# Patient Record
Sex: Male | Born: 1975
Health system: Southern US, Community
[De-identification: ages and names within clinical notes are randomized; demographics above are authoritative.]

## PROBLEM LIST (undated history)

## (undated) DIAGNOSIS — R0789 Other chest pain: Secondary | ICD-10-CM

## (undated) DIAGNOSIS — K219 Gastro-esophageal reflux disease without esophagitis: Secondary | ICD-10-CM

## (undated) HISTORY — DX: Gastro-esophageal reflux disease without esophagitis: K21.9

## (undated) HISTORY — DX: Other chest pain: R07.89

---

## 2006-08-18 ENCOUNTER — Emergency Department (HOSPITAL_COMMUNITY): Admission: EM | Admit: 2006-08-18 | Discharge: 2006-08-18 | Payer: Self-pay | Admitting: Emergency Medicine

## 2008-04-28 ENCOUNTER — Emergency Department (HOSPITAL_COMMUNITY): Admission: EM | Admit: 2008-04-28 | Discharge: 2008-04-29 | Payer: Self-pay | Admitting: Emergency Medicine

## 2010-05-17 ENCOUNTER — Encounter: Payer: Self-pay | Admitting: Cardiology

## 2010-05-17 DIAGNOSIS — R079 Chest pain, unspecified: Secondary | ICD-10-CM

## 2010-05-17 DIAGNOSIS — R9431 Abnormal electrocardiogram [ECG] [EKG]: Secondary | ICD-10-CM

## 2010-05-20 ENCOUNTER — Ambulatory Visit: Payer: Self-pay | Admitting: Cardiology

## 2010-07-07 NOTE — Assessment & Plan Note (Signed)
Summary: ***np6/chest pains /abnormal ekg pt has uhc-mb   Visit Type:  Initial Consult Primary [name]:  Dr.Robert kelly  CC:  chest pain abnormal ekg.  History of Present Illness: [name] comes to office today for evaluation and management of some discomfort in his chest and left arm as well as an abnormal EKG.  He has developed a very light flutter that goes down the inside of his left arm over the past week or so. Sometimes shooting sensation as well. On last seconds. He says the chest pressure in the center of his chest over the last 2-3 days. It is not related to exertion. He does get mildly short of breath with short burst of activity and does not exercise on a regular basis. 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 He has 2 small children. He says is under quite a bit of stress.  He denies any exertional presyncope or syncope, palpitations, dizziness, or chest discomfort. He's never had a previous cardiac condition. He has no risk factors for coronary disease.He  has a history of gastroesophageal reflux but these symptoms are different.    Preventive Screening-Counseling & Management  Alcohol-Tobacco     Smoking Status: never  Caffeine-Diet-Exercise     Does Patient Exercise: no      Drug Use:  no.    Current Medications (verified): 1)  Prilosec 20 Mg Cpdr (Omeprazole) .... Take 1 Tab Daily 2)  Omeprazole 40 Mg Cpdr (Omeprazole) .... Take 1 Tab Daily 3)  Mucinex D 60-600 Mg Xr12h-Tab (Pseudoephedrine-Guaifenesin) .... Take As Needed 4)  Lotemax 0.5 % Susp (Loteprednol Etabonate) .Marland Kitchen.. 1 Drop Two Times A Day 5)  Restasis 0.05 % Emul (Cyclosporine) .Marland Kitchen.. 1drop Each Eye Two Times A Day  Allergies (verified): 1)  ! * Codene 2)  ! * Vicodine 3)  ! * Shell Fish  Social History: Full Time Married  Tobacco Use - No.  Alcohol Use - no Regular Exercise - no Drug Use - no Smoking Status:  never Does Patient Exercise:  no Drug Use:   no  Review of Systems       negative other than history of present illness  Vital Signs:  Patient profile:   35 year old male Height:      70 inches Weight:      196 pounds BMI:     28.22 O2 Sat:      98 % on Room air Pulse rate:   75 / minute BP sitting:   138 / 89  (right arm)  Vitals Entered By: Dreama Saa, CNA (May 17, 2010 1:37 PM)  O2 Flow:  Room air  Physical Exam  General:  Well developed, well nourished, in no acute distress. Head:  normocephalic and atraumatic Eyes:  PERRLA/EOM intact; conjunctiva and lids normal. Neck:  Neck supple, no JVD. No masses, thyromegaly or abnormal cervical nodes. Chest Wall:  no deformities or breast masses noted Lungs:  Clear bilaterally to auscultation and percussion. Heart:  PMI nondisplaced, regular rate and rhythm, mild sinus arrhythmia, no S1-S2 splits, no murmur click, no carotid bruits Abdomen:  L., good bowel sounds, no tenderness no bruit Msk:  Back normal, normal gait. Muscle strength and tone normal. Pulses:  pulses normal in all 4 extremities Extremities:  No clubbing or cyanosis. Neurologic:  Alert and oriented x 3. Skin:  Intact without lesions or rashes. Psych:  Normal affect.   Problems:  Medical Problems Added: 1)  Dx of Abnormal Ekg  (ICD-794.31) 2)  Dx of Chest Pain-unspecified  (ICD-786.50)  Impression & Recommendations:  Problem # 1:  CHEST PAIN-UNSPECIFIED (ICD-786.50) Assessment New I feel this is noncardiac. History is benign, exam is normal, his EKG is essentially normal except for a minimal amount of R. prime in V1 and 2. I explained this to him at length. I feel a lot of this is stress and lack of exercise. Reassurance given and advised to exercise 45 minutes 4 times a week. I told him to choose something enjoys doing so he'll continue with it. No other cardiac evaluation necessary or indicated.  Problem # 2:  ABNORMAL EKG (ICD-794.31) Assessment: New minimal RSR prime V1 V2, clinically not  significant at this time.  Patient Instructions: 1)  Your physician recommends that you schedule a follow-up appointment in: as needed 2)  Your physician discussed the importance of regular exercise and recommended that you start or continue a regular exercise program for good health.

## 2010-07-07 NOTE — Letter (Signed)
Summary: [facility]   Imported By: Faythe Ghee 05/17/2010 15:31:49  _____________________________________________________________________  External Attachment:    Type:   Image     Comment:   External Document

## 2011-05-18 ENCOUNTER — Encounter: Payer: Self-pay | Admitting: Cardiology

## 2014-09-10 ENCOUNTER — Other Ambulatory Visit: Payer: Self-pay | Admitting: Occupational Medicine

## 2014-09-10 ENCOUNTER — Ambulatory Visit: Payer: Self-pay

## 2014-09-10 DIAGNOSIS — M79671 Pain in right foot: Secondary | ICD-10-CM

## 2015-12-04 IMAGING — CR DG FOOT COMPLETE 3+V*R*
3 series · 3 of 3 positions shown · non-contrast
Comparison: None.

CLINICAL DATA: Right foot injury yesterday, dorsal metatarsal pain

EXAM:
RIGHT FOOT COMPLETE - 3+ VIEW

[view not recorded (1 of 3)]
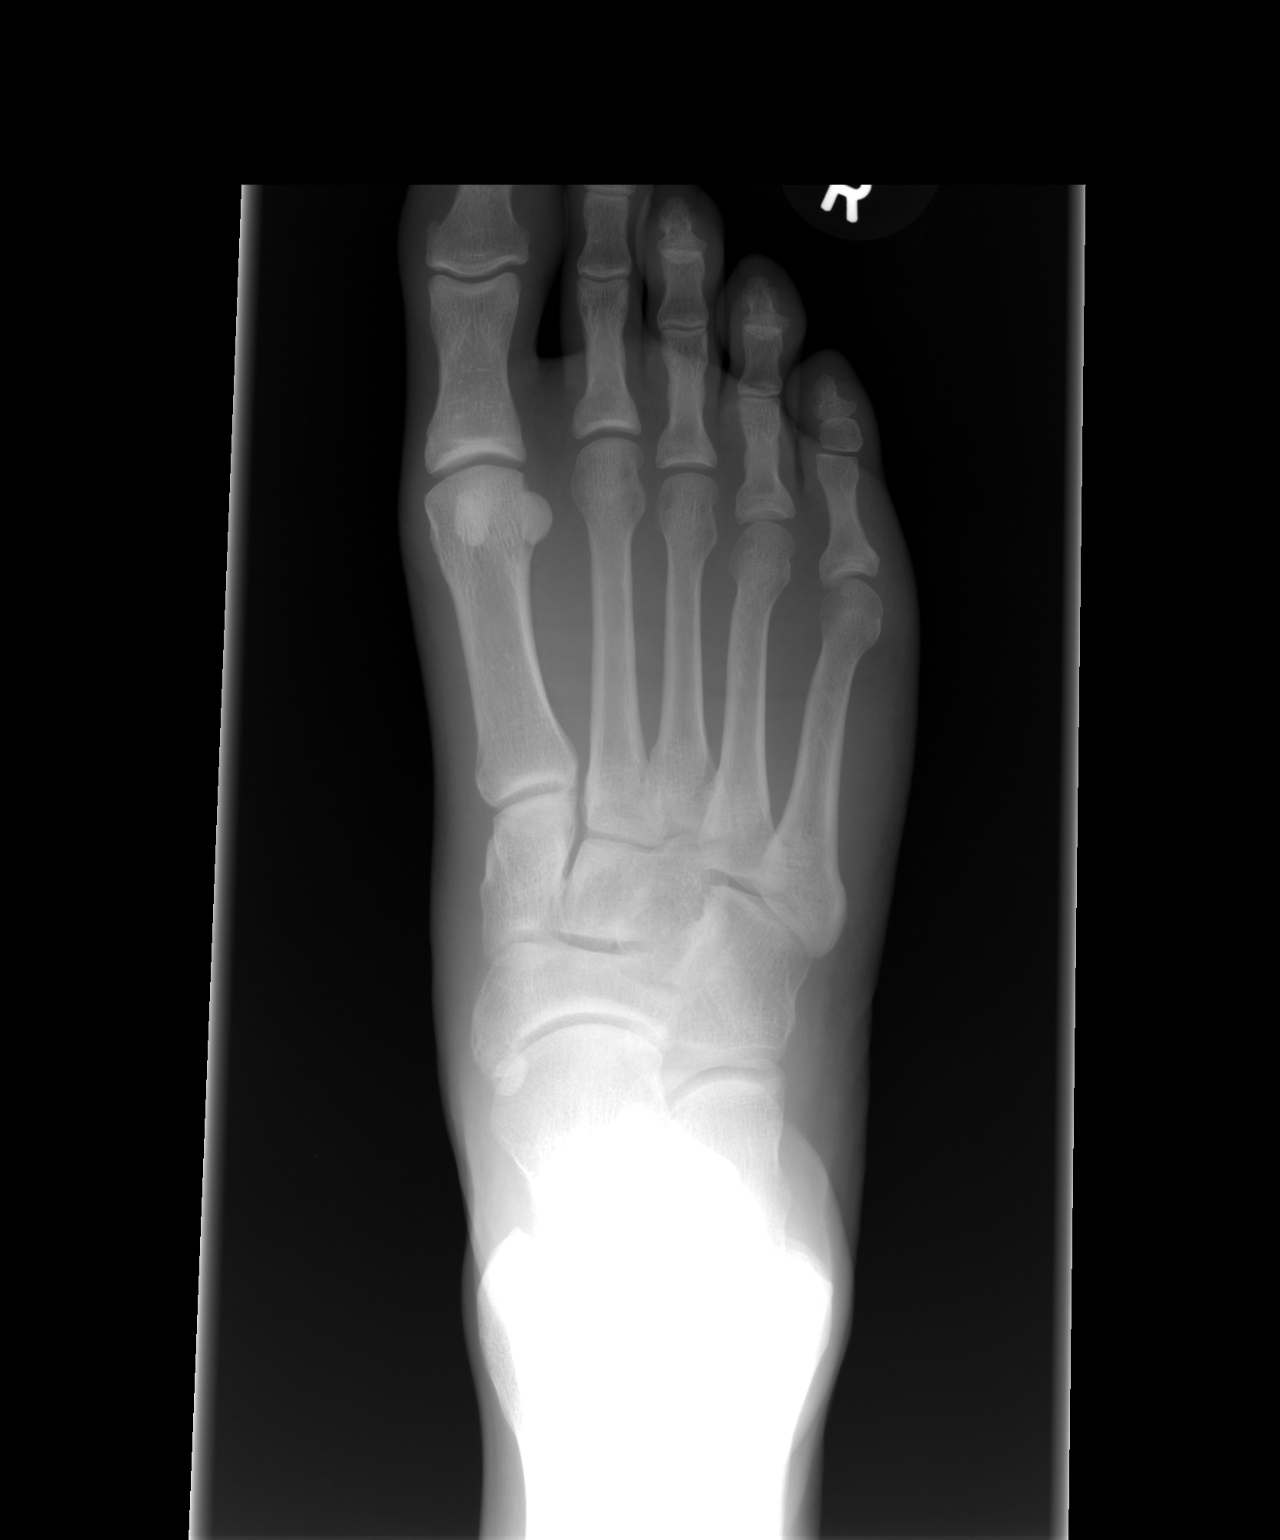

[view not recorded (2 of 3)]
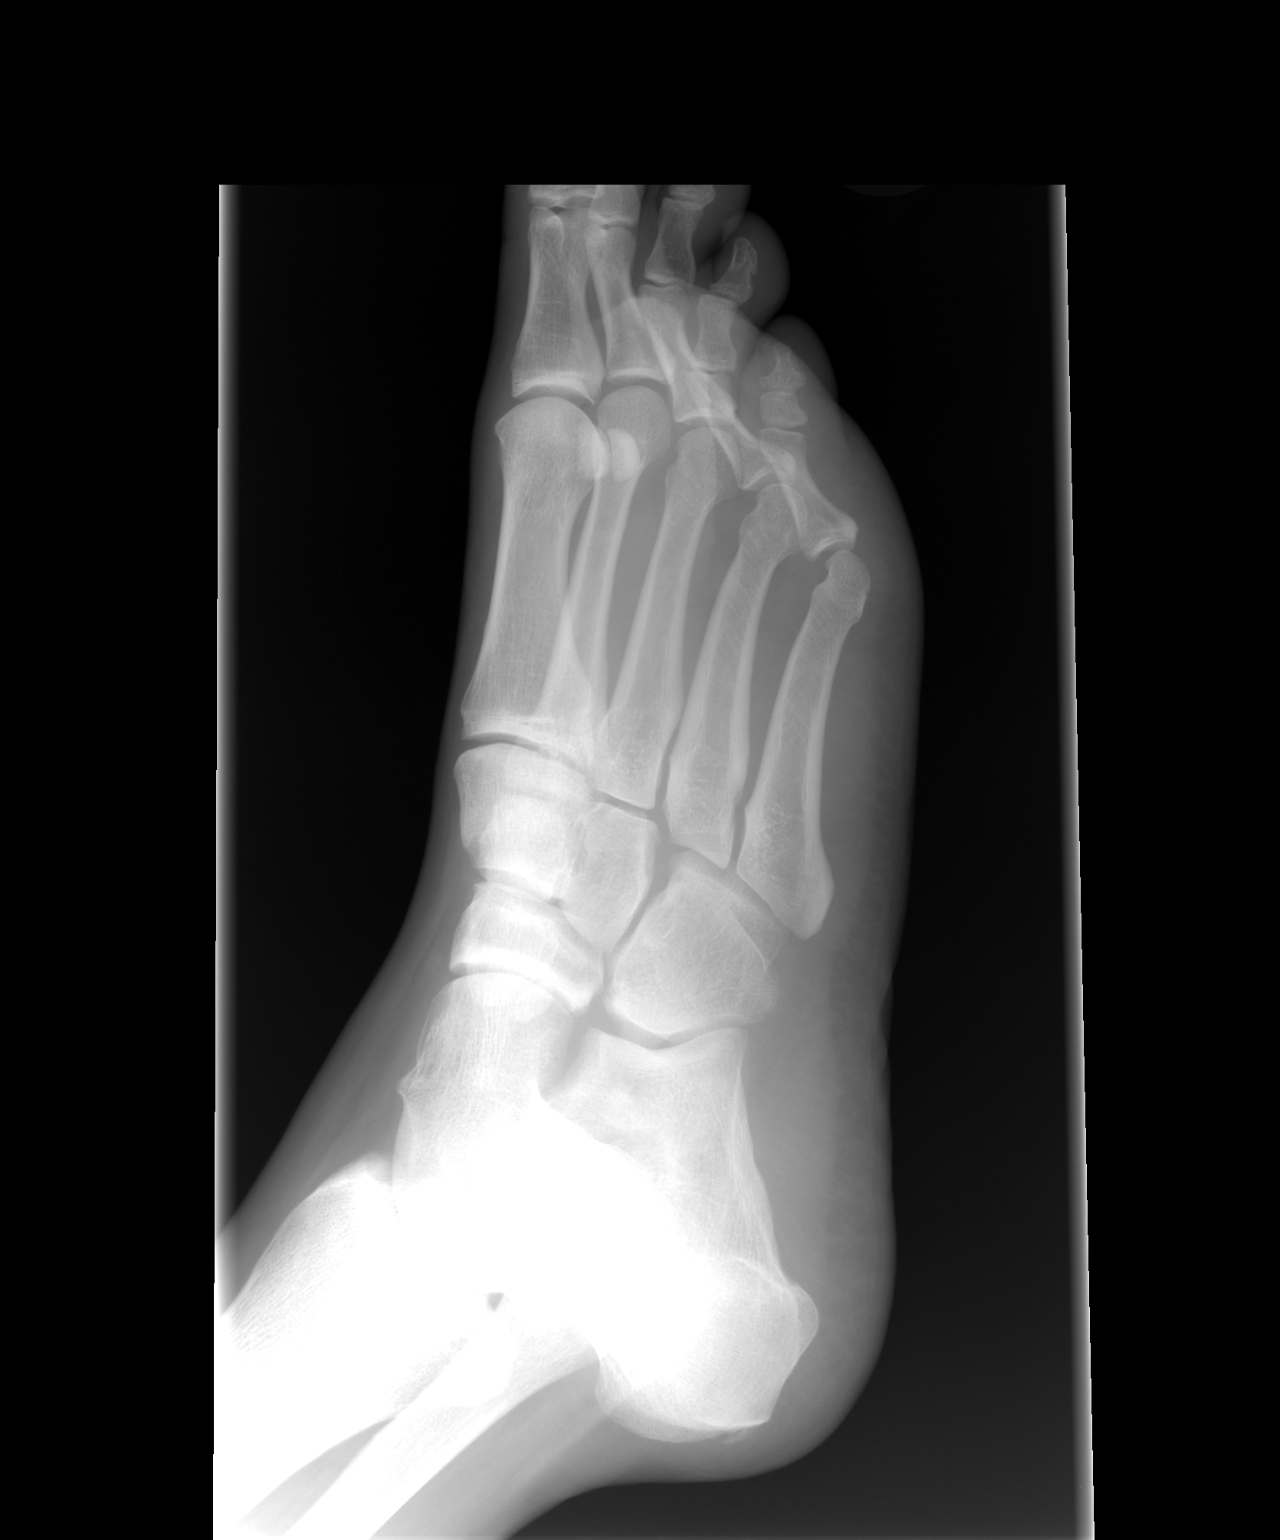

[view not recorded (3 of 3)]
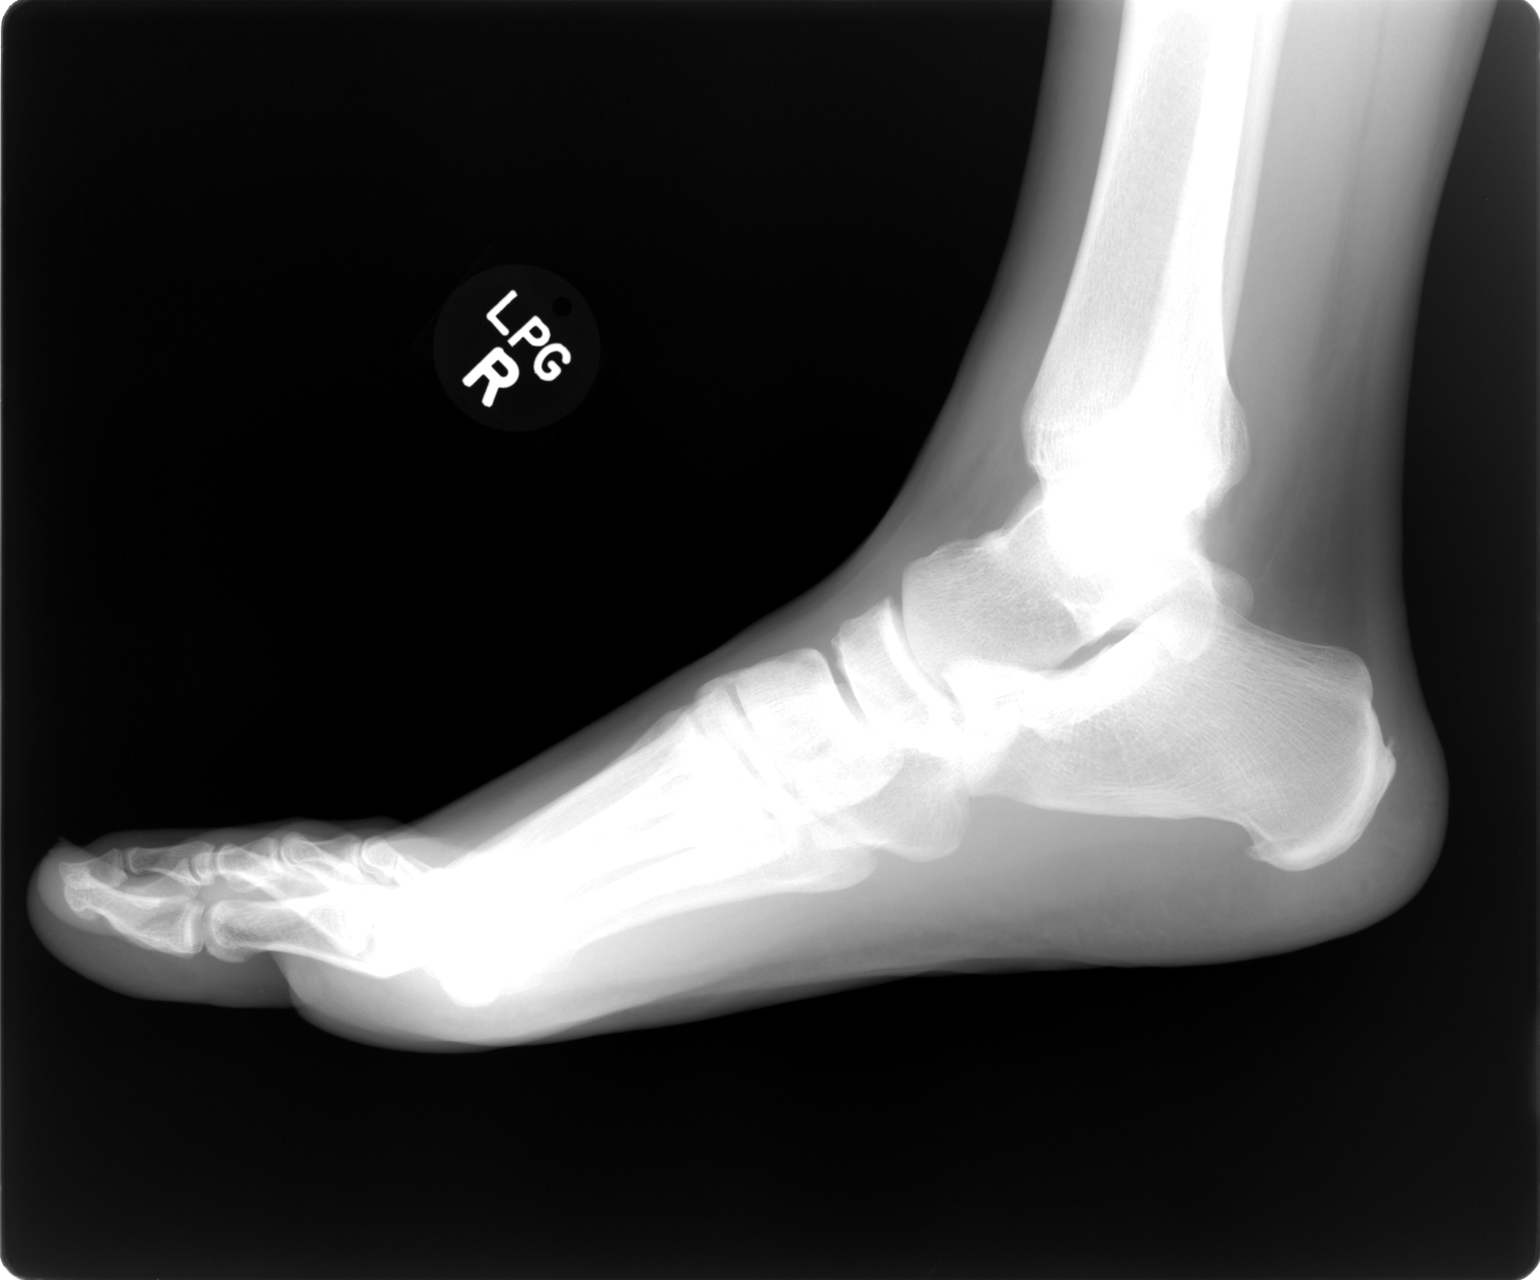

[3 of 3 positions shown; findings below may reference images not displayed]

FINDINGS: Three views of the right foot submitted. No acute fracture or
subluxation. Tiny plantar and posterior spur of calcaneus.
IMPRESSION: No acute fracture or subluxation. Tiny plantar and posterior spur of
calcaneus.

## 2019-02-13 ENCOUNTER — Other Ambulatory Visit: Payer: Self-pay

## 2019-02-13 DIAGNOSIS — Z20822 Contact with and (suspected) exposure to covid-19: Secondary | ICD-10-CM

## 2019-02-15 LAB — NOVEL CORONAVIRUS, NAA: SARS-CoV-2, NAA: NOT DETECTED

## 2019-05-13 DIAGNOSIS — R7301 Impaired fasting glucose: Secondary | ICD-10-CM | POA: Diagnosis not present

## 2019-05-13 DIAGNOSIS — E7800 Pure hypercholesterolemia, unspecified: Secondary | ICD-10-CM | POA: Diagnosis not present

## 2019-05-13 DIAGNOSIS — R079 Chest pain, unspecified: Secondary | ICD-10-CM | POA: Diagnosis not present

## 2019-05-13 DIAGNOSIS — Z0000 Encounter for general adult medical examination without abnormal findings: Secondary | ICD-10-CM | POA: Diagnosis not present

## 2019-07-01 ENCOUNTER — Ambulatory Visit: Payer: Medicaid Other | Attending: Internal Medicine

## 2019-07-01 DIAGNOSIS — Z20822 Contact with and (suspected) exposure to covid-19: Secondary | ICD-10-CM | POA: Insufficient documentation

## 2019-07-02 LAB — NOVEL CORONAVIRUS, NAA: SARS-CoV-2, NAA: NOT DETECTED

## 2019-07-04 ENCOUNTER — Ambulatory Visit: Payer: Medicaid Other | Attending: Internal Medicine

## 2019-07-04 DIAGNOSIS — Z20822 Contact with and (suspected) exposure to covid-19: Secondary | ICD-10-CM

## 2019-07-05 ENCOUNTER — Telehealth: Payer: Self-pay | Admitting: Physician Assistant

## 2019-07-05 LAB — NOVEL CORONAVIRUS, NAA: SARS-CoV-2, NAA: DETECTED — AB

## 2019-07-05 NOTE — Telephone Encounter (Signed)
Patient was contacted regarding positive covid 19 result. Spoke with wife who is also positive. Pt's symptoms are mild. No shortness of breath. We went over CDC guidelines for quarantine and ER precautions.    Cline Crock PA-C  [facility]

## 2019-07-07 DIAGNOSIS — R0602 Shortness of breath: Secondary | ICD-10-CM | POA: Diagnosis not present

## 2019-07-07 DIAGNOSIS — U071 COVID-19: Secondary | ICD-10-CM | POA: Diagnosis not present

## 2019-07-16 DIAGNOSIS — U071 COVID-19: Secondary | ICD-10-CM | POA: Diagnosis not present

## 2019-07-31 DIAGNOSIS — J180 Bronchopneumonia, unspecified organism: Secondary | ICD-10-CM | POA: Diagnosis not present

## 2019-07-31 DIAGNOSIS — Z8616 Personal history of COVID-19: Secondary | ICD-10-CM | POA: Diagnosis not present

## 2019-08-04 DIAGNOSIS — M795 Residual foreign body in soft tissue: Secondary | ICD-10-CM | POA: Diagnosis not present

## 2019-08-04 DIAGNOSIS — M79674 Pain in right toe(s): Secondary | ICD-10-CM | POA: Diagnosis not present

## 2019-08-04 DIAGNOSIS — R0683 Snoring: Secondary | ICD-10-CM | POA: Diagnosis not present

## 2019-08-19 DIAGNOSIS — R5383 Other fatigue: Secondary | ICD-10-CM | POA: Diagnosis not present

## 2019-08-19 DIAGNOSIS — Z8616 Personal history of COVID-19: Secondary | ICD-10-CM | POA: Diagnosis not present

## 2019-08-20 DIAGNOSIS — R0683 Snoring: Secondary | ICD-10-CM | POA: Diagnosis not present

## 2019-08-20 DIAGNOSIS — G4710 Hypersomnia, unspecified: Secondary | ICD-10-CM | POA: Diagnosis not present

## 2019-08-29 DIAGNOSIS — G933 Postviral fatigue syndrome: Secondary | ICD-10-CM | POA: Diagnosis not present

## 2019-08-29 DIAGNOSIS — Z8616 Personal history of COVID-19: Secondary | ICD-10-CM | POA: Diagnosis not present

## 2020-06-24 DIAGNOSIS — J40 Bronchitis, not specified as acute or chronic: Secondary | ICD-10-CM | POA: Diagnosis not present

## 2020-06-24 DIAGNOSIS — Z20822 Contact with and (suspected) exposure to covid-19: Secondary | ICD-10-CM | POA: Diagnosis not present

## 2020-08-13 DIAGNOSIS — E785 Hyperlipidemia, unspecified: Secondary | ICD-10-CM | POA: Diagnosis not present

## 2020-08-13 DIAGNOSIS — Z8249 Family history of ischemic heart disease and other diseases of the circulatory system: Secondary | ICD-10-CM | POA: Diagnosis not present

## 2020-08-13 DIAGNOSIS — Z0000 Encounter for general adult medical examination without abnormal findings: Secondary | ICD-10-CM | POA: Diagnosis not present

## 2020-08-13 DIAGNOSIS — Z6829 Body mass index (BMI) 29.0-29.9, adult: Secondary | ICD-10-CM | POA: Diagnosis not present

## 2020-08-13 DIAGNOSIS — Z8616 Personal history of COVID-19: Secondary | ICD-10-CM | POA: Diagnosis not present

## 2020-08-13 DIAGNOSIS — R0600 Dyspnea, unspecified: Secondary | ICD-10-CM | POA: Diagnosis not present

## 2020-10-11 DIAGNOSIS — Z0000 Encounter for general adult medical examination without abnormal findings: Secondary | ICD-10-CM | POA: Diagnosis not present

## 2020-10-11 DIAGNOSIS — R7301 Impaired fasting glucose: Secondary | ICD-10-CM | POA: Diagnosis not present

## 2020-10-11 DIAGNOSIS — E785 Hyperlipidemia, unspecified: Secondary | ICD-10-CM | POA: Diagnosis not present

## 2020-10-22 DIAGNOSIS — E785 Hyperlipidemia, unspecified: Secondary | ICD-10-CM | POA: Diagnosis not present

## 2020-10-22 DIAGNOSIS — R0600 Dyspnea, unspecified: Secondary | ICD-10-CM | POA: Diagnosis not present

## 2020-10-22 DIAGNOSIS — Z8249 Family history of ischemic heart disease and other diseases of the circulatory system: Secondary | ICD-10-CM | POA: Diagnosis not present

## 2021-04-07 ENCOUNTER — Encounter: Payer: Self-pay | Admitting: Emergency Medicine

## 2021-04-07 ENCOUNTER — Ambulatory Visit
Admission: EM | Admit: 2021-04-07 | Discharge: 2021-04-07 | Disposition: A | Discharge status: Home / Self Care | Payer: BC Managed Care – PPO

## 2021-04-07 ENCOUNTER — Other Ambulatory Visit: Payer: Self-pay

## 2021-04-07 DIAGNOSIS — J029 Acute pharyngitis, unspecified: Secondary | ICD-10-CM | POA: Diagnosis not present

## 2021-04-07 LAB — POCT RAPID STREP A (OFFICE): Rapid Strep A Screen: NEGATIVE

## 2021-04-07 MED ORDER — CEFDINIR 300 MG PO CAPS: 300.0000 mg | ORAL_CAPSULE | Freq: Two times a day (BID) | ORAL | 0 refills | Status: AC

## 2021-04-07 NOTE — ED Triage Notes (Signed)
Pt c/o ST, fever, and swollen glands x 6 days.

## 2021-04-07 NOTE — Discharge Instructions (Addendum)
I recommend the following for at-home care:   Take Cefdinir as prescribed Push fluids (water, Gatorade, Pedialyte).   Tylenol or ibuprofen for pain or fever.  Warm salt water gargles for throat pain as needed.  Throw away toothbrush after 2 days of treatment.   Be sure to complete all of your antibiotic, even if you are feeling better sooner.  Be advised that you will be infectious for 24 hours after starting antibiotics.  Follow up with PCP if not improving in 2 - 3 days.   ED for significant worsening of pain, persistent fever, drooling, or neck stiffness.

## 2021-04-07 NOTE — ED Provider Notes (Signed)
CHIEF COMPLAINT:   Chief Complaint  Patient presents with   Sore Throat   Fever     SUBJECTIVE/HPI:   Sore Throat  Fever A very pleasant 45 y.o.Male presents today with sore throat, fever and swollen glands for the last 6 days. Tmax 100.629F. Patient does not report any shortness of breath, chest pain, palpitations, visual changes, weakness, tingling, headache, nausea, vomiting, diarrhea, chills.   has a past medical history of Atypical chest pain and GERD (gastroesophageal reflux disease).  ROS:  Review of Systems  Constitutional:  Positive for fever.  See Subjective/HPI Medications, Allergies and Problem List personally reviewed in Epic today OBJECTIVE:   Vitals:   04/07/21 1120  BP: 138/88  Pulse: 75  Temp: 98.8 F (37.1 C)  SpO2: 96%    Physical Exam   General: Appears well-developed and well-nourished. No acute distress.  HEENT Head: Normocephalic and atraumatic.   Ears: Hearing grossly intact, no drainage or visible deformity.  Nose: No nasal deviation.   Mouth/Throat: No stridor or tracheal deviation.  + erythematous posterior pharynx noted with clear drainage present.  + white patchy exudate noted. Eyes: Conjunctivae and EOM are normal. No eye drainage or scleral icterus bilaterally.  Neck: Normal range of motion, neck is supple. + bilateral anterior cervical lymph nodes palpated.  Tender, mobile and soft. Cardiovascular: Normal rate Pulm/Chest: No respiratory distress Neurological: Alert and oriented to person, place, and time.  Skin: Skin is warm and dry.  No rashes, lesions, abrasions or bruising noted to skin.   Psychiatric: Normal mood, affect, behavior, and thought content.   Vital signs and nursing note reviewed.   Patient stable and cooperative with examination.  Centor Criteria History of fever,  Tonsillar exudates,  Tender anterior cervical adenopathy,  Absence of cough,   > or =3, no test required  Centor Score: 4  *Due to increased  Centor score patient will be treated with antibiotics  PROCEDURES:    LABS/X-RAYS/EKG/MEDS:   Results for orders placed or performed during the hospital encounter of 04/07/21  POCT rapid strep A  Result Value Ref Range   Rapid Strep A Screen Negative Negative    MEDICAL DECISION MAKING:   Patient presents with sore throat, fever and swollen glands for the last 6 days. Tmax 100.629F. Patient does not report any shortness of breath, chest pain, palpitations, visual changes, weakness, tingling, headache, nausea, vomiting, diarrhea, chills.  Strep negative.  Centor score of 4, will treat for exudative pharyngitis with cefdinir.  Advised about home treatment and care to include fluids, Tylenol versus ibuprofen, warm salt gargles, throw away her toothbrush after 2 days of treatment.  ED for any significant worsening of pain, persistent fever, drooling or neck stiffness.  Patient verbalized understanding and agreed with treatment plan.  Patient stable upon discharge. ASSESSMENT/PLAN:  1. Exudative pharyngitis - cefdinir (OMNICEF) 300 MG capsule; Take 1 capsule (300 mg total) by mouth 2 (two) times daily for 7 days.  Dispense: 14 capsule; Refill: 0 Instructions about new medications and side effects provided.  Plan:   Discharge Instructions      I recommend the following for at-home care:   Take Cefdinir as prescribed Push fluids (water, Gatorade, Pedialyte).   Tylenol or ibuprofen for pain or fever.  Warm salt water gargles for throat pain as needed.  Throw away toothbrush after 2 days of treatment.   Be sure to complete all of your antibiotic, even if you are feeling better sooner.  Be advised that you will  be infectious for 24 hours after starting antibiotics.  Follow up with PCP if not improving in 2 - 3 days.   ED for significant worsening of pain, persistent fever, drooling, or neck stiffness.           Amalia Greenhouse, FNP 04/07/21 1146

## 2021-04-10 DIAGNOSIS — R051 Acute cough: Secondary | ICD-10-CM | POA: Diagnosis not present

## 2021-04-10 DIAGNOSIS — J029 Acute pharyngitis, unspecified: Secondary | ICD-10-CM | POA: Diagnosis not present

## 2021-04-10 DIAGNOSIS — J069 Acute upper respiratory infection, unspecified: Secondary | ICD-10-CM | POA: Diagnosis not present

## 2022-02-21 ENCOUNTER — Ambulatory Visit
Admission: EM | Admit: 2022-02-21 | Discharge: 2022-02-21 | Disposition: A | Discharge status: Home / Self Care | Payer: BC Managed Care – PPO | Attending: Physician Assistant | Admitting: Physician Assistant

## 2022-02-21 DIAGNOSIS — J0110 Acute frontal sinusitis, unspecified: Secondary | ICD-10-CM

## 2022-02-21 DIAGNOSIS — Z1152 Encounter for screening for COVID-19: Secondary | ICD-10-CM

## 2022-02-21 DIAGNOSIS — R051 Acute cough: Secondary | ICD-10-CM | POA: Diagnosis present

## 2022-02-21 DIAGNOSIS — J069 Acute upper respiratory infection, unspecified: Secondary | ICD-10-CM

## 2022-02-21 DIAGNOSIS — R509 Fever, unspecified: Secondary | ICD-10-CM | POA: Insufficient documentation

## 2022-02-21 LAB — RESP PANEL BY RT-PCR (FLU A&B, COVID) ARPGX2
Influenza A by PCR: POSITIVE — AB
Influenza B by PCR: NEGATIVE
SARS Coronavirus 2 by RT PCR: NEGATIVE

## 2022-02-21 MED ORDER — ALBUTEROL SULFATE HFA 108 (90 BASE) MCG/ACT IN AERS: 1.0000 | INHALATION_SPRAY | Freq: Four times a day (QID) | RESPIRATORY_TRACT | 0 refills | Status: AC | PRN

## 2022-02-21 MED ORDER — SPACER/AERO-HOLD CHAMBER BAGS MISC
1.0000 [IU] | Freq: Three times a day (TID) | 0 refills | Status: AC
Start: 2022-02-21 — End: ?

## 2022-02-21 MED ORDER — AMOXICILLIN-POT CLAVULANATE 875-125 MG PO TABS: 1.0000 | ORAL_TABLET | Freq: Two times a day (BID) | ORAL | 0 refills | Status: AC

## 2022-02-21 MED ORDER — DM-GUAIFENESIN ER 30-600 MG PO TB12: 1.0000 | ORAL_TABLET | Freq: Two times a day (BID) | ORAL | 0 refills | Status: AC

## 2022-02-21 NOTE — ED Provider Notes (Signed)
[facility]    CSN: [id number] Arrival date & time: 02/21/22  [id number]      History   Chief Complaint Chief Complaint  Patient presents with   Cough   Fever    HPI Ronald Bailey is a 46 y.o. male.   46 year old male presents with fever, chills, and cough.  Patient indicates that 3 weeks ago he was diagnosed with COVID.  He relates he did seem to improve until over the past couple days he has been having fever which has been 101, body aches and pains, chills.  He relates he has been having cough with chest congestion and the production has been green.  He indicates that he is also having upper respiratory symptoms associated with postnasal drip and rhinitis, sinus pressure, production is green.  He indicates that he has having mild shortness of breath with his cough however he has not have any wheezing.  He indicates that he is concerned that he may have flu due to his body aches and pains.  He also indicates that the right before his symptoms started his son was sick with similar type symptoms.  He does indicate that he has had COVID several times, when he originally had COVID the first time he he did have some long COVID symptoms which tended to linger.  He indicates he has been taking OTC medications without relief of his present symptoms.  Tolerating fluids well.   Cough Associated symptoms: chills, fever and shortness of breath   Fever Associated symptoms: chills and cough     Past Medical History:  Diagnosis Date   Atypical chest pain    GERD (gastroesophageal reflux disease)     Patient Active Problem List   Diagnosis Date Noted   CHEST PAIN-UNSPECIFIED 05/17/2010   ABNORMAL EKG 05/17/2010    No past surgical history on file.     Home Medications    Prior to Admission medications   Medication Sig Start Date End Date Taking? Authorizing Provider  albuterol (VENTOLIN HFA) 108 (90 Base) MCG/ACT inhaler Inhale 1-2 puffs into the lungs every 6 (six)  hours as needed for wheezing or shortness of breath. 02/21/22  Yes Nyoka Lint, PA-C  amoxicillin-clavulanate (AUGMENTIN) 875-125 MG tablet Take 1 tablet by mouth every 12 (twelve) hours. 02/21/22  Yes Nyoka Lint, PA-C  dextromethorphan-guaiFENesin [employer] DM) 30-600 MG 12hr tablet Take 1 tablet by mouth 2 (two) times daily. 02/21/22  Yes Nyoka Lint, PA-C  Spacer/Aero-Hold Chamber Bags MISC 1 Units by Does not apply route in the morning, at noon, and at bedtime. 02/21/22  Yes Nyoka Lint, PA-C  cycloSPORINE (RESTASIS) 0.05 % ophthalmic emulsion Place 1 drop into both eyes 2 (two) times daily.      [provider]  EPINEPHrine 0.3 mg/0.3 mL IJ SOAJ injection Inject into the muscle. 05/13/19   [provider]  loteprednol (LOTEMAX) 0.5 % ophthalmic suspension Place 1 drop into both eyes 2 (two) times daily.      [provider]  omeprazole (PRILOSEC) 20 MG capsule Take 20 mg by mouth daily.      [provider]  omeprazole (PRILOSEC) 40 MG capsule Take 40 mg by mouth daily.      [provider]  pseudoephedrine-guaifenesin (MUCINEX D) 60-600 MG per tablet Take 1 tablet by mouth as needed.      [provider]    Family History Family History  Problem Relation Age of Onset   Hypertension Mother    Diabetes Mother  Social History Social History   Tobacco Use   Smoking status: Never   Smokeless tobacco: Never  Vaping Use   Vaping Use: Never used  Substance Use Topics   Alcohol use: No   Drug use: No     Allergies   Codeine, Hydrocodone-acetaminophen, and Shellfish allergy   Review of Systems Review of Systems  Constitutional:  Positive for chills, fatigue and fever.  HENT:  Positive for sinus pressure and sinus pain.   Respiratory:  Positive for cough and shortness of breath.      Physical Exam Triage Vital Signs ED Triage Vitals  Enc Vitals Group     BP 02/21/22 0929 127/85     Pulse Rate 02/21/22 0929 (!) 109      Resp 02/21/22 0929 18     Temp 02/21/22 0929 98 F (36.7 C)     Temp src --      SpO2 02/21/22 0929 97 %     Weight --      Height --      Head Circumference --      Peak Flow --      Pain Score 02/21/22 0931 4     Pain Loc --      Pain Edu? --      Excl. in [place]? --    No data found.  Updated Vital Signs BP 127/85   Pulse (!) 109   Temp 98 F (36.7 C)   Resp 18   SpO2 97%   Visual Acuity Right Eye Distance:   Left Eye Distance:   Bilateral Distance:    Right Eye Near:   Left Eye Near:    Bilateral Near:     Physical Exam Constitutional:      Appearance: Normal appearance.  HENT:     Right Ear: Ear canal normal. Tympanic membrane is injected.     Left Ear: Ear canal normal. Tympanic membrane is injected.     Mouth/Throat:     Mouth: Mucous membranes are moist.     Pharynx: Oropharynx is clear. No posterior oropharyngeal erythema.  Cardiovascular:     Rate and Rhythm: Normal rate and regular rhythm.     Heart sounds: Normal heart sounds.  Pulmonary:     Effort: Pulmonary effort is normal.     Breath sounds: Normal breath sounds and air entry. No wheezing, rhonchi or rales.  Lymphadenopathy:     Cervical: No cervical adenopathy.  Neurological:     Mental Status: He is alert.      UC Treatments / Results  Labs (all labs ordered are listed, but only abnormal results are displayed) Labs Reviewed  RESP PANEL BY RT-PCR (FLU A&B, COVID) [id number]    EKG   Radiology No results found.  Procedures Procedures (including critical care time)  Medications Ordered in UC Medications - No data to display  Initial Impression / Assessment and Plan / UC Course  I have reviewed the triage vital signs and the nursing notes.  Pertinent labs & imaging results that were available during my care of the patient were reviewed by me and considered in my medical decision making (see chart for details).       Plan: 1.  Flu and COVID test is pending. 2.  Advised to  use the albuterol inhaler with spacer, 2 puffs every 6-8 hours as needed for cough, congestion and shortness of breath. 3.  Advised Mucinex DM every 12 hours to help control the cough and congestion. 4.  Advised take the Augmentin every 12 hours with food to help treat the sinus and respiratory infection. 5.  Advised follow-up with PCP or return to urgent care if symptoms fail to improve.    Discharge Instructions      COVID and flu test will be completed in 24 hours or less, if you do not get a call from this office it indicates these test are negative, you can go on MyChart to view the results in 24 hours or less. Advised to use the albuterol inhaler with spacer, 2 puffs every 6-8 hours as needed for shortness of breath cough and chest congestion. Advised to use Mucinex DM every 12 hours for cough and chest congestion. Advised take Augmentin every 12 hours with food to treat the respiratory and sinus infection. Advised to follow-up with PCP or return to urgent care if symptoms fail to improve.    ED Prescriptions     Medication Sig Dispense Auth. Provider   dextromethorphan-guaiFENesin (MUCINEX DM) 30-600 MG 12hr tablet Take 1 tablet by mouth 2 (two) times daily. 20 tablet Nyoka Lint, PA-C   albuterol (VENTOLIN HFA) 108 (90 Base) MCG/ACT inhaler Inhale 1-2 puffs into the lungs every 6 (six) hours as needed for wheezing or shortness of breath. 6.7 g Nyoka Lint, PA-C   Spacer/Aero-Hold Chamber Bags MISC 1 Units by Does not apply route in the morning, at noon, and at bedtime. 1 Units Nyoka Lint, PA-C   amoxicillin-clavulanate (AUGMENTIN) 875-125 MG tablet Take 1 tablet by mouth every 12 (twelve) hours. 14 tablet Nyoka Lint, PA-C      PDMP not reviewed this encounter.   Nyoka Lint, PA-C 02/21/22 [phone]

## 2022-02-21 NOTE — Discharge Instructions (Addendum)
COVID and flu test will be completed in 24 hours or less, if you do not get a call from this office it indicates these test are negative, you can go on MyChart to view the results in 24 hours or less. Advised to use the albuterol inhaler with spacer, 2 puffs every 6-8 hours as needed for shortness of breath cough and chest congestion. Advised to use Mucinex DM every 12 hours for cough and chest congestion. Advised take Augmentin every 12 hours with food to treat the respiratory and sinus infection. Advised to follow-up with PCP or return to urgent care if symptoms fail to improve.

## 2022-02-21 NOTE — ED Triage Notes (Signed)
Pt presents to [facility] with co of flu like symptoms for 2 days. Pt reports he had covid 3 weeks ago and was scheduled to have a follow up this week and was feeling better but 2 days ago he started having generalized body aches and pains, fevers, and congestion. Pt has taken motrin for pain and symptoms.

## 2022-02-22 ENCOUNTER — Telehealth (HOSPITAL_COMMUNITY): Payer: Self-pay | Admitting: Emergency Medicine

## 2022-02-22 MED ORDER — OSELTAMIVIR PHOSPHATE 75 MG PO CAPS: 75.0000 mg | ORAL_CAPSULE | Freq: Two times a day (BID) | ORAL | 0 refills | Status: AC

## 2022-05-01 ENCOUNTER — Ambulatory Visit
Admission: EM | Admit: 2022-05-01 | Discharge: 2022-05-01 | Disposition: A | Discharge status: Home / Self Care | Payer: 59 | Attending: Internal Medicine | Admitting: Internal Medicine

## 2022-05-01 ENCOUNTER — Ambulatory Visit (INDEPENDENT_AMBULATORY_CARE_PROVIDER_SITE_OTHER): Payer: 59

## 2022-05-01 ENCOUNTER — Encounter: Payer: Self-pay | Admitting: Emergency Medicine

## 2022-05-01 DIAGNOSIS — Z23 Encounter for immunization: Secondary | ICD-10-CM

## 2022-05-01 DIAGNOSIS — W278XXA Contact with other nonpowered hand tool, initial encounter: Secondary | ICD-10-CM

## 2022-05-01 DIAGNOSIS — M79644 Pain in right finger(s): Secondary | ICD-10-CM

## 2022-05-01 DIAGNOSIS — S62662A Nondisplaced fracture of distal phalanx of right middle finger, initial encounter for closed fracture: Secondary | ICD-10-CM

## 2022-05-01 MED ORDER — TETANUS-DIPHTH-ACELL PERTUSSIS 5-2.5-18.5 LF-MCG/0.5 IM SUSY
0.5000 mL | PREFILLED_SYRINGE | Freq: Once | INTRAMUSCULAR | Status: AC
  Administered 2022-05-01: 0.5 mL via INTRAMUSCULAR

## 2022-05-01 NOTE — ED Provider Notes (Addendum)
[facility]    CSN: [id number] Arrival date & time: 05/01/22  0940      History   Chief Complaint Chief Complaint  Patient presents with   Finger Injury    HPI Ronald Bailey is a 46 y.o. male.   Patient presents with right middle finger injury that occurred yesterday around 5 PM.  Patient reports that he was fixing a fence with a sledgehammer and accidentally hit the tip of his finger with the sledgehammer.  Does not take any blood thinning medications.  Denies any numbness or tingling.  Has full range of motion of finger.  He reports that it has been bleeding intermittently.  He has taken ibuprofen for pain with improvement.  Last tetanus vaccine is unknown.     Past Medical History:  Diagnosis Date   Atypical chest pain    GERD (gastroesophageal reflux disease)     Patient Active Problem List   Diagnosis Date Noted   CHEST PAIN-UNSPECIFIED 05/17/2010   ABNORMAL EKG 05/17/2010    History reviewed. No pertinent surgical history.     Home Medications    Prior to Admission medications   Medication Sig Start Date End Date Taking? Authorizing Provider  albuterol (VENTOLIN HFA) 108 (90 Base) MCG/ACT inhaler Inhale 1-2 puffs into the lungs every 6 (six) hours as needed for wheezing or shortness of breath. 02/21/22   Ellsworth LennoxJames, David, PA-C  amoxicillin-clavulanate (AUGMENTIN) 875-125 MG tablet Take 1 tablet by mouth every 12 (twelve) hours. 02/21/22   Ellsworth LennoxJames, David, PA-C  cycloSPORINE (RESTASIS) 0.05 % ophthalmic emulsion Place 1 drop into both eyes 2 (two) times daily.      [provider]  dextromethorphan-guaiFENesin (MUCINEX DM) 30-600 MG 12hr tablet Take 1 tablet by mouth 2 (two) times daily. 02/21/22   Ellsworth LennoxJames, David, PA-C  EPINEPHrine 0.3 mg/0.3 mL IJ SOAJ injection Inject into the muscle. 05/13/19   [provider]  loteprednol (LOTEMAX) 0.5 % ophthalmic suspension Place 1 drop into both eyes 2 (two) times daily.      [provider]  omeprazole (PRILOSEC) 20 MG capsule Take 20 mg by mouth daily.      [provider]  omeprazole (PRILOSEC) 40 MG capsule Take 40 mg by mouth daily.      [provider]  oseltamivir (TAMIFLU) 75 MG capsule Take 1 capsule (75 mg total) by mouth every 12 (twelve) hours. 02/22/22   Mardella LaymanHagler, Brian, MD  pseudoephedrine-guaifenesin (MUCINEX D) 60-600 MG per tablet Take 1 tablet by mouth as needed.      [provider]  Spacer/Aero-Hold Chamber Bags MISC 1 Units by Does not apply route in the morning, at noon, and at bedtime. 02/21/22   Ellsworth LennoxJames, David, PA-C    Family History Family History  Problem Relation Age of Onset   Hypertension Mother    Diabetes Mother     Social History Social History   Tobacco Use   Smoking status: Never   Smokeless tobacco: Never  Vaping Use   Vaping Use: Never used  Substance Use Topics   Alcohol use: No   Drug use: No     Allergies   Codeine, Hydrocodone-acetaminophen, and Shellfish allergy   Review of Systems Review of Systems Per HPI  Physical Exam Triage Vital Signs ED Triage Vitals  Enc Vitals Group     BP 05/01/22 1012 (!) 149/92     Pulse Rate 05/01/22 1012 (!) 55     Resp 05/01/22 1012 16  Temp 05/01/22 1012 98.2 F (36.8 C)     Temp src --      SpO2 05/01/22 1012 98 %     Weight --      Height --      Head Circumference --      Peak Flow --      Pain Score 05/01/22 1017 4     Pain Loc --      Pain Edu? --      Excl. in [place]? --    No data found.  Updated Vital Signs BP (!) 149/92   Pulse (!) 55   Temp 98.2 F (36.8 C)   Resp 16   SpO2 98%   Visual Acuity Right Eye Distance:   Left Eye Distance:   Bilateral Distance:    Right Eye Near:   Left Eye Near:    Bilateral Near:     Physical Exam Constitutional:      General: He is not in acute distress.    Appearance: Normal appearance. He is not toxic-appearing or diaphoretic.  HENT:     Head: Normocephalic and atraumatic.  Eyes:      Extraocular Movements: Extraocular movements intact.     Conjunctiva/sclera: Conjunctivae normal.  Pulmonary:     Effort: Pulmonary effort is normal.  Musculoskeletal:     Comments: Tenderness to palpation throughout distal tip of right third digit.  Bluish discoloration present to pad of same finger.  Capillary refill and pulses are intact.  Patient has full range of motion of finger.  Skin:    Comments: Very Minimal subungual hematoma present to proximal portion of nail of right third digit.  Patient has very minimal linear superficial avulsion laceration present directly above cuticle of nail of right third digit.  Bleeding is controlled.  Nail is still intact to nailbed completely.  Neurological:     General: No focal deficit present.     Mental Status: He is alert and oriented to person, place, and time. Mental status is at baseline.  Psychiatric:        Mood and Affect: Mood normal.        Behavior: Behavior normal.        Thought Content: Thought content normal.        Judgment: Judgment normal.      UC Treatments / Results  Labs (all labs ordered are listed, but only abnormal results are displayed) Labs Reviewed - No data to display  EKG   Radiology DG Finger Middle Right  Result Date: 05/01/2022 CLINICAL DATA:  Right middle finger injury. Excellent hit finger using a sledgehammer. Pain. EXAM: RIGHT MIDDLE FINGER 2+V COMPARISON:  None Available. FINDINGS: Normal bone mineralization. There are at least two longitudinal linear fracture lines within the distal aspect of the distal phalanx of the third finger without significant displacement. Mild surrounding soft tissue swelling of the distal third finger. No dislocation. Joint spaces are preserved. IMPRESSION: Acute nondisplaced fractures of the distal aspect of the distal phalanx of the third finger. Electronically Signed   By: Neita Garnet M.D.   On: 05/01/2022 10:54    Procedures Procedures (including critical care  time)  Medications Ordered in UC Medications  Tdap (BOOSTRIX) injection 0.5 mL (0.5 mLs Intramuscular Given 05/01/22 1126)    Initial Impression / Assessment and Plan / UC Course  I have reviewed the triage vital signs and the nursing notes.  Pertinent labs & imaging results that were available during my care of the patient  were reviewed by me and considered in my medical decision making (see chart for details).     X-ray of right third digit shows 2 nondisplaced fractures of distal end of finger.  Spoke with Earney Hamburg with on-call hand specialty.  He advised aluminum finger splint and following up with Dr. Frazier Butt in 1 to 2 weeks.  Provided patient with contact information for orthopedist and advised to follow-up to schedule appointment.  Discussed supportive care including ice application, OTC pain relievers, elevation of extremity.  Offered patient pain medication but he declined.  Patient had very minimal subungual hematoma noted to fingernail.  Cautery gun was used to drain with minimal bloody drainage.  Patient is neurovascularly intact despite injury so no need for emergent evaluation due to this.  Patient has very superficial broken skin/avulsion laceration present directly above the fingernail.  Bleeding is controlled.  No need for sutures or repair given it is very minimal and wound edges are closely approximated.  Advised patient to monitor for any signs of infection and to follow-up if these occur.  Nonadherent dressing was applied by nursing staff for this prior to discharge.  Tetanus vaccine updated today.  Earney Hamburg with hand specialty also advised an aluminum finger splint which we do not have here in this urgent care.  We do have this available at the [facility] urgent care location so patient was advised to go over to the [facility] location to get finger splint placed by nursing staff/[employer].  He was agreeable to this and left this urgent care to go over there  to have finger splint placed.  Nursing staff and providers at urgent location were notified that patient was on his way.  Discussed strict return precautions.  Patient verbalized understanding and was agreeable with plan.  Addendum: Medical staff at [address] urgent care messaged [place] to notify us that they do not have any access to aluminum finger splints either.  Clinical staff here at this urgent care called patient to notify them.  They were advised that another option is to go to the [facility] urgent care to have them place a finger splint.  Stated that he did not want to do this and pay another co-pay possibly.  Therefore, patient was educated that most pharmacies or [facility] have these types of finger splints in stock.  Educated patient on which type of finger splint to get and patient stated that he would go get a finger splint from the pharmacy.  Advised patient that he may come back to urgent care to have [place] place it if he feels it is necessary.  Patient states understanding and was agreeable. Final Clinical Impressions(s) / UC Diagnoses   Final diagnoses:  Nondisplaced fracture of distal phalanx of right middle finger, initial encounter for closed fracture  Need for tetanus booster     Discharge Instructions      You have fractured your finger.  Please follow-up with orthopedic specialist at provided contact information to schedule an appointment for approximately 1 week.  Monitor broken skin/laceration for any signs of infection that include increased redness, swelling, pus and follow-up sooner if this occurs.  Recommend ice application and over-the-counter pain relievers as we discussed.  Please go to the [facility] urgent care location to receive an aluminum finger splint soon as you leave this urgent care.  Leave this on until otherwise advised.  No pushing, pulling, lifting with upper extremity.     ED Prescriptions   None  PDMP not reviewed this encounter.   Gustavus Bryant, [place] 05/01/22 1140    [address], [place] 05/01/22 1222

## 2022-05-01 NOTE — Discharge Instructions (Signed)
You have fractured your finger.  Please follow-up with orthopedic specialist at provided contact information to schedule an appointment for approximately 1 week.  Monitor broken skin/laceration for any signs of infection that include increased redness, swelling, pus and follow-up sooner if this occurs.  Recommend ice application and over-the-counter pain relievers as we discussed.  Please go to the [facility] urgent care location to receive an aluminum finger splint soon as you leave this urgent care.  Leave this on until otherwise advised.  No pushing, pulling, lifting with upper extremity.

## 2022-05-01 NOTE — ED Triage Notes (Signed)
Pt is present today with an injury to his right middle finger. Pt states that he accidentally hit his finger while using a sledge hammer. Pt states that he noticed bleeding and discomfort after. Pt denies being on blood thinners.

## 2022-07-24 ENCOUNTER — Ambulatory Visit
Admission: EM | Admit: 2022-07-24 | Discharge: 2022-07-24 | Disposition: A | Discharge status: Home / Self Care | Payer: 59 | Attending: Internal Medicine | Admitting: Internal Medicine

## 2022-07-24 DIAGNOSIS — J069 Acute upper respiratory infection, unspecified: Secondary | ICD-10-CM | POA: Insufficient documentation

## 2022-07-24 DIAGNOSIS — R0789 Other chest pain: Secondary | ICD-10-CM | POA: Insufficient documentation

## 2022-07-24 DIAGNOSIS — H109 Unspecified conjunctivitis: Secondary | ICD-10-CM

## 2022-07-24 DIAGNOSIS — R509 Fever, unspecified: Secondary | ICD-10-CM | POA: Diagnosis not present

## 2022-07-24 DIAGNOSIS — Z1152 Encounter for screening for COVID-19: Secondary | ICD-10-CM | POA: Diagnosis not present

## 2022-07-24 DIAGNOSIS — R030 Elevated blood-pressure reading, without diagnosis of hypertension: Secondary | ICD-10-CM

## 2022-07-24 LAB — POCT INFLUENZA A/B
Influenza A, POC: NEGATIVE
Influenza B, POC: NEGATIVE

## 2022-07-24 MED ORDER — ERYTHROMYCIN 5 MG/GM OP OINT: TOPICAL_OINTMENT | OPHTHALMIC | 0 refills | Status: AC

## 2022-07-24 MED ORDER — BENZONATATE 100 MG PO CAPS: 100.0000 mg | ORAL_CAPSULE | Freq: Three times a day (TID) | ORAL | 0 refills | Status: AC | PRN

## 2022-07-24 MED ORDER — FLUTICASONE PROPIONATE 50 MCG/ACT NA SUSP: 1.0000 | Freq: Every day | NASAL | 0 refills | Status: AC

## 2022-07-24 NOTE — ED Triage Notes (Signed)
Pt c/o cough, malaise, headache, body aches, chills   Onset ~ sat   Also c/o conjunctivitis

## 2022-07-24 NOTE — ED Provider Notes (Addendum)
[facility]    CSN: [id number] Arrival date & time: 07/24/22  0836      History   Chief Complaint Chief Complaint  Patient presents with   Cough    HPI Ronald Bailey is a 47 y.o. male.   Patient presents with nasal congestion, cough, fatigue generalized body aches, chills, fever that started about 2 days ago.  Patient reports Tmax at home was 100.  Denies any known sick contacts.  Denies shortness of breath, sore throat, ear pain, nausea, vomiting, diarrhea, abdominal pain.  Patient has taken Tylenol and over-the-counter cold and flu medications with minimal improvement in symptoms.  Denies history of asthma or COPD.  Patient also reporting some right eye irritation and drainage that started about 1 to 2 days ago.  Denies trauma or foreign body to the eye.  Patient does not wear glasses or contacts.  Denies vision changes.  Patient also reporting some centralized chest pain that has been intermittent over the past few months.  Patient states it is a sharp pain.  He denies any associated shortness of breath, headache, dizziness, blurred vision, nausea, vomiting.  Patient states this has occurred before about 1 to 2 years ago.  He saw family doctor and cardiology for it where he had a stress test.  He states that he thinks the stress test was unremarkable.  Chest pain recently returned a few months ago.  Denies any current chest pain.  States that he has not followed up with his family doctor recently given that his family doctor passed away and he has not found a new one.  Blood pressure is mildly elevated today.  Denies history of hypertension or ever having to take medications for it.   Cough   Past Medical History:  Diagnosis Date   Atypical chest pain    GERD (gastroesophageal reflux disease)     Patient Active Problem List   Diagnosis Date Noted   CHEST PAIN-UNSPECIFIED 05/17/2010   ABNORMAL EKG 05/17/2010    History reviewed. No pertinent surgical  history.     Home Medications    Prior to Admission medications   Medication Sig Start Date End Date Taking? Authorizing Provider  benzonatate (TESSALON) 100 MG capsule Take 1 capsule (100 mg total) by mouth every 8 (eight) hours as needed for cough. 07/24/22  Yes Maysa Lynn, Hildred Alamin E, FNP  erythromycin ophthalmic ointment Place a 1/2 inch ribbon of ointment into the lower eyelid 4 times daily for 7 days. 07/24/22  Yes Maressa Apollo, Hildred Alamin E, FNP  fluticasone (FLONASE) 50 MCG/ACT nasal spray Place 1 spray into both nostrils daily. 07/24/22  Yes Danyetta Gillham, Michele Rockers, FNP  albuterol (VENTOLIN HFA) 108 (90 Base) MCG/ACT inhaler Inhale 1-2 puffs into the lungs every 6 (six) hours as needed for wheezing or shortness of breath. 02/21/22   Nyoka Lint, PA-C  amoxicillin-clavulanate (AUGMENTIN) 875-125 MG tablet Take 1 tablet by mouth every 12 (twelve) hours. 02/21/22   Nyoka Lint, PA-C  cycloSPORINE (RESTASIS) 0.05 % ophthalmic emulsion Place 1 drop into both eyes 2 (two) times daily.      [provider]  dextromethorphan-guaiFENesin (MUCINEX DM) 30-600 MG 12hr tablet Take 1 tablet by mouth 2 (two) times daily. 02/21/22   Nyoka Lint, PA-C  EPINEPHrine 0.3 mg/0.3 mL IJ SOAJ injection Inject into the muscle. 05/13/19   [provider]  loteprednol (LOTEMAX) 0.5 % ophthalmic suspension Place 1 drop into both eyes 2 (two) times daily.      [provider]  omeprazole (PRILOSEC) 20 MG capsule Take 20 mg by mouth daily.      [provider]  omeprazole (PRILOSEC) 40 MG capsule Take 40 mg by mouth daily.      [provider]  oseltamivir (TAMIFLU) 75 MG capsule Take 1 capsule (75 mg total) by mouth every 12 (twelve) hours. 02/22/22   Vanessa Kick, MD  pseudoephedrine-guaifenesin (MUCINEX D) 60-600 MG per tablet Take 1 tablet by mouth as needed.      [provider]  Spacer/Aero-Hold Chamber Bags MISC 1 Units by Does not apply route in the morning, at noon, and at bedtime.  02/21/22   Nyoka Lint, PA-C    Family History Family History  Problem Relation Age of Onset   Hypertension Mother    Diabetes Mother     Social History Social History   Tobacco Use   Smoking status: Never   Smokeless tobacco: Never  Vaping Use   Vaping Use: Never used  Substance Use Topics   Alcohol use: No   Drug use: No     Allergies   Codeine, Hydrocodone-acetaminophen, and Shellfish allergy   Review of Systems Review of Systems Per HPI  Physical Exam Triage Vital Signs ED Triage Vitals  Enc Vitals Group     BP 07/24/22 0856 (!) 152/112     Pulse Rate 07/24/22 0854 87     Resp 07/24/22 0854 16     Temp 07/24/22 0854 98.9 F (37.2 C)     Temp Source 07/24/22 0854 Oral     SpO2 07/24/22 0854 97 %     Weight --      Height --      Head Circumference --      Peak Flow --      Pain Score 07/24/22 0854 5     Pain Loc --      Pain Edu? --      Excl. in [place]? --    No data found.  Updated Vital Signs BP (!) 141/100   Pulse 87   Temp 98.9 F (37.2 C) (Oral)   Resp 16   SpO2 97%   Visual Acuity Right Eye Distance:   Left Eye Distance:   Bilateral Distance:    Right Eye Near:   Left Eye Near:    Bilateral Near:     Physical Exam Constitutional:      General: He is not in acute distress.    Appearance: Normal appearance. He is not toxic-appearing or diaphoretic.  HENT:     Head: Normocephalic and atraumatic.     Right Ear: Tympanic membrane and ear canal normal.     Left Ear: Tympanic membrane and ear canal normal.     Nose: Congestion present.     Mouth/Throat:     Mouth: Mucous membranes are moist.     Pharynx: No posterior oropharyngeal erythema.  Eyes:     General: Lids are normal. Lids are everted, no foreign bodies appreciated. Vision grossly intact. Gaze aligned appropriately.     Extraocular Movements: Extraocular movements intact.     Conjunctiva/sclera:     Right eye: Right conjunctiva is injected. Exudate present. No chemosis or  hemorrhage.    Left eye: Left conjunctiva is not injected. No chemosis, exudate or hemorrhage.    Pupils: Pupils are equal, round, and reactive to light.  Cardiovascular:     Rate and Rhythm: Normal rate and regular rhythm.     Pulses: Normal pulses.     Heart  sounds: Normal heart sounds.  Pulmonary:     Effort: Pulmonary effort is normal. No respiratory distress.     Breath sounds: Normal breath sounds. No stridor. No wheezing, rhonchi or rales.  Abdominal:     General: Abdomen is flat. Bowel sounds are normal.     Palpations: Abdomen is soft.  Musculoskeletal:        General: Normal range of motion.     Cervical back: Normal range of motion.  Skin:    General: Skin is warm and dry.  Neurological:     General: No focal deficit present.     Mental Status: He is alert and oriented to person, place, and time. Mental status is at baseline.     Cranial Nerves: Cranial nerves 2-12 are intact.     Sensory: Sensation is intact.     Motor: Motor function is intact.     Coordination: Coordination is intact.     Gait: Gait is intact.  Psychiatric:        Mood and Affect: Mood normal.        Behavior: Behavior normal.      UC Treatments / Results  Labs (all labs ordered are listed, but only abnormal results are displayed) Labs Reviewed  SARS CORONAVIRUS 2 (TAT 6-24 HRS)  POCT INFLUENZA A/B    EKG   Radiology No results found.  Procedures Procedures (including critical care time)  Medications Ordered in UC Medications - No data to display  Initial Impression / Assessment and Plan / UC Course  I have reviewed the triage vital signs and the nursing notes.  Pertinent labs & imaging results that were available during my care of the patient were reviewed by me and considered in my medical decision making (see chart for details).     Patient presents with symptoms likely from a viral upper respiratory infection. Do not suspect underlying cardiopulmonary process. Symptoms  seem unlikely related to ACS, CHF or COPD exacerbations, pneumonia, pneumothorax. Patient is nontoxic appearing and not in need of emergent medical intervention.  Rapid flu was negative.  COVID test pending. Recommended symptom control with medications and supportive care.  Patient sent prescriptions.  Erythromycin to treat right bacterial conjunctivitis. Visual acuity appears normal.   Patient reporting chest pain that has been intermittent over the past few months but was also present about 1 to 2 years ago.  He has also already seen cardiology for this with possible negative stress test per patient report.  It appears after further review of the chart, stress test was unremarkable.  EKG completed today that was normal sinus rhythm.  Given symptoms have been intermittent for the past few months and was also present 1 to 2 years ago, do not think emergent evaluation is necessary as vital signs are also stable.  Therefore, patient was advised to follow-up with established cardiologist and family doctor for further evaluation and management as well as for blood pressure management.  Advised patient to monitor blood pressure closely and have close follow-up for this as well.  His blood pressure is only mildly elevated today.  He wishes to make appointment with family doctor himself.  Also advised strict ER precautions if symptoms persist or worsen.  Patient verbalized understanding.  Patient states understanding and is agreeable.  Discharged with PCP followup.  Final Clinical Impressions(s) / UC Diagnoses   Final diagnoses:  Viral upper respiratory tract infection with cough  Other chest pain  Elevated blood pressure reading  Bacterial conjunctivitis of  right eye     Discharge Instructions      It appears that you have a viral upper respiratory infection that should run its course and self resolve with the help of symptomatic treatment.  I have prescribed you a few different medications to help  alleviate symptoms.  Rapid flu is negative.  COVID test is pending.  Will call if it is positive.  Your EKG was completely normal.  Recommend that you follow-up with your established cardiologist for further evaluation.  Also recommend following up with family doctor.    ED Prescriptions     Medication Sig Dispense Auth. Provider   fluticasone (FLONASE) 50 MCG/ACT nasal spray Place 1 spray into both nostrils daily. 16 g Selia Wareing, Hildred Alamin E, [place]   benzonatate (TESSALON) 100 MG capsule Take 1 capsule (100 mg total) by mouth every 8 (eight) hours as needed for cough. 21 capsule Aline, [place] E, [place]   erythromycin ophthalmic ointment Place a 1/2 inch ribbon of ointment into the lower eyelid 4 times daily for 7 days. 3.5 g Teodora Medici, [place]      PDMP not reviewed this encounter.   Teodora Medici, [place] 07/24/22 [place], [place], [place] 07/24/22 1029

## 2022-07-24 NOTE — Discharge Instructions (Signed)
It appears that you have a viral upper respiratory infection that should run its course and self resolve with the help of symptomatic treatment.  I have prescribed you a few different medications to help alleviate symptoms.  Rapid flu is negative.  COVID test is pending.  Will call if it is positive.  Your EKG was completely normal.  Recommend that you follow-up with your established cardiologist for further evaluation.  Also recommend following up with family doctor.

## 2022-07-25 LAB — SARS CORONAVIRUS 2 (TAT 6-24 HRS): SARS Coronavirus 2: NEGATIVE

## 2023-01-14 ENCOUNTER — Emergency Department (HOSPITAL_COMMUNITY)
Admission: EM | Admit: 2023-01-14 | Discharge: 2023-01-14 | Disposition: A | Discharge status: Home / Self Care | Payer: 59 | Attending: Emergency Medicine | Admitting: Emergency Medicine

## 2023-01-14 ENCOUNTER — Emergency Department (HOSPITAL_COMMUNITY): Payer: 59

## 2023-01-14 ENCOUNTER — Other Ambulatory Visit: Payer: Self-pay

## 2023-01-14 ENCOUNTER — Encounter (HOSPITAL_COMMUNITY): Payer: Self-pay

## 2023-01-14 DIAGNOSIS — R197 Diarrhea, unspecified: Secondary | ICD-10-CM | POA: Insufficient documentation

## 2023-01-14 DIAGNOSIS — M7918 Myalgia, other site: Secondary | ICD-10-CM | POA: Diagnosis not present

## 2023-01-14 DIAGNOSIS — J189 Pneumonia, unspecified organism: Secondary | ICD-10-CM | POA: Diagnosis not present

## 2023-01-14 DIAGNOSIS — Z20822 Contact with and (suspected) exposure to covid-19: Secondary | ICD-10-CM | POA: Insufficient documentation

## 2023-01-14 DIAGNOSIS — R0602 Shortness of breath: Secondary | ICD-10-CM | POA: Diagnosis present

## 2023-01-14 LAB — CBC
HCT: 44.3 % (ref 39.0–52.0)
Hemoglobin: 14.4 g/dL (ref 13.0–17.0)
MCH: 28.6 pg (ref 26.0–34.0)
MCHC: 32.5 g/dL (ref 30.0–36.0)
MCV: 88.1 fL (ref 80.0–100.0)
Platelets: 222 10*3/uL (ref 150–400)
RBC: 5.03 MIL/uL (ref 4.22–5.81)
RDW: 12.6 % (ref 11.5–15.5)
WBC: 10.2 10*3/uL (ref 4.0–10.5)
nRBC: 0 % (ref 0.0–0.2)

## 2023-01-14 LAB — COMPREHENSIVE METABOLIC PANEL
ALT: 23 U/L (ref 0–44)
AST: 20 U/L (ref 15–41)
Albumin: 3.7 g/dL (ref 3.5–5.0)
Alkaline Phosphatase: 57 U/L (ref 38–126)
Anion gap: 11 (ref 5–15)
BUN: 11 mg/dL (ref 6–20)
CO2: 21 mmol/L — ABNORMAL LOW (ref 22–32)
Calcium: 8.4 mg/dL — ABNORMAL LOW (ref 8.9–10.3)
Chloride: 99 mmol/L (ref 98–111)
Creatinine, Ser: 1.05 mg/dL (ref 0.61–1.24)
GFR, Estimated: >60 mL/min (ref >60)
Glucose, Bld: 122 mg/dL — ABNORMAL HIGH (ref 70–99)
Potassium: 3.8 mmol/L (ref 3.5–5.1)
Sodium: 131 mmol/L — ABNORMAL LOW (ref 135–145)
Total Bilirubin: 0.5 mg/dL (ref 0.3–1.2)
Total Protein: 6.8 g/dL (ref 6.5–8.1)

## 2023-01-14 LAB — URINALYSIS, ROUTINE W REFLEX MICROSCOPIC
Bilirubin Urine: NEGATIVE
Glucose, UA: NEGATIVE mg/dL
Hgb urine dipstick: NEGATIVE
Ketones, ur: NEGATIVE mg/dL
Leukocytes,Ua: NEGATIVE
Nitrite: NEGATIVE
Protein, ur: NEGATIVE mg/dL
Specific Gravity, Urine: 1.017 (ref 1.005–1.030)
pH: 6 (ref 5.0–8.0)

## 2023-01-14 LAB — SARS CORONAVIRUS 2 BY RT PCR: SARS Coronavirus 2 by RT PCR: NEGATIVE

## 2023-01-14 LAB — LIPASE, BLOOD: Lipase: 27 U/L (ref 11–51)

## 2023-01-14 MED ORDER — SODIUM CHLORIDE 0.9 % IV SOLN
1.0000 g | Freq: Once | INTRAVENOUS | Status: AC
  Administered 2023-01-14: 1 g via INTRAVENOUS
  Filled 2023-01-14: qty 10

## 2023-01-14 MED ORDER — ONDANSETRON HCL 4 MG PO TABS: 4.0000 mg | ORAL_TABLET | Freq: Four times a day (QID) | ORAL | 0 refills | Status: AC | PRN

## 2023-01-14 MED ORDER — DOXYCYCLINE HYCLATE 100 MG PO CAPS: 100.0000 mg | ORAL_CAPSULE | Freq: Two times a day (BID) | ORAL | 0 refills | Status: AC

## 2023-01-14 MED ORDER — SODIUM CHLORIDE 0.9 % IV SOLN
500.0000 mg | Freq: Once | INTRAVENOUS | Status: AC
  Administered 2023-01-14: 500 mg via INTRAVENOUS
  Filled 2023-01-14: qty 5

## 2023-01-14 MED ORDER — ACETAMINOPHEN 325 MG PO TABS
650.0000 mg | ORAL_TABLET | Freq: Once | ORAL | Status: AC
Start: 2023-01-14 — End: 2023-01-14
  Administered 2023-01-14: 650 mg via ORAL
  Filled 2023-01-14: qty 2

## 2023-01-14 MED ORDER — SODIUM CHLORIDE 0.9 % IV BOLUS
1000.0000 mL | Freq: Once | INTRAVENOUS | Status: AC
  Administered 2023-01-14: 1000 mL via INTRAVENOUS

## 2023-01-14 NOTE — ED Triage Notes (Signed)
Pt c/o diarrhea, fever, productive cough w/greenish/brown mucous, HA, lightheaded, [id number]

## 2023-01-14 NOTE — Discharge Instructions (Signed)
It was a pleasure taking part in your care today.  As we discussed, you do have a multi lobar pneumonia.  This is the most likely cause of your symptoms.  I would like for you to start taking doxycycline twice daily for the next 5 days.  Please follow-up with your PCP for reevaluation and reimaging of your chest to ensure resolution of your pneumonia upon completion of antibiotics.  If you become lightheaded, dizzy, weak or began to feel worse please return to the ED for further management.  Please take Zofran every 6 hours as needed for nausea.

## 2023-01-14 NOTE — ED Provider Notes (Signed)
[place] EMERGENCY DEPARTMENT AT [facility] Provider Note   CSN: [id number] Arrival date & time: 01/14/23  1509     History  Chief Complaint  Patient presents with   Diarrhea    Ronald Bailey is a 47 y.o. male with history of GERD, atypical chest pain.  Patient presents to ED for evaluation of shortness of breath, bodyaches and chills, fever, diarrhea and headache.  Patient reports that the symptoms began on Thursday.  Patient states that they have been progressively worsening since onset.  He denies medications prior to arrival.  He states that his wife is currently sick with pneumonia, his son is also sick with pneumonia.  The patient reports that he has had a fever at home up to 101 Fahrenheit.  He is endorsing nausea without vomiting.  He denies one-sided weakness or numbness, trouble swallowing.  He denies sore throat.  Denies lightheadedness, dizziness, weakness.   Diarrhea Associated symptoms: chills, fever, headaches and myalgias   Associated symptoms: no vomiting        Home Medications Prior to Admission medications   Medication Sig Start Date End Date Taking? Authorizing Provider  doxycycline (VIBRAMYCIN) 100 MG capsule Take 1 capsule (100 mg total) by mouth 2 (two) times daily. 01/14/23  Yes Al Decant, PA-C  ondansetron (ZOFRAN) 4 MG tablet Take 1 tablet (4 mg total) by mouth every 6 (six) hours as needed for nausea or vomiting. 01/14/23  Yes Al Decant, PA-C  albuterol (VENTOLIN HFA) 108 (90 Base) MCG/ACT inhaler Inhale 1-2 puffs into the lungs every 6 (six) hours as needed for wheezing or shortness of breath. 02/21/22   Ellsworth Lennox, PA-C  amoxicillin-clavulanate (AUGMENTIN) 875-125 MG tablet Take 1 tablet by mouth every 12 (twelve) hours. 02/21/22   Ellsworth Lennox, PA-C  benzonatate (TESSALON) 100 MG capsule Take 1 capsule (100 mg total) by mouth every 8 (eight) hours as needed for cough. 07/24/22   Gustavus Bryant, FNP  cycloSPORINE  (RESTASIS) 0.05 % ophthalmic emulsion Place 1 drop into both eyes 2 (two) times daily.      [provider]  dextromethorphan-guaiFENesin (MUCINEX DM) 30-600 MG 12hr tablet Take 1 tablet by mouth 2 (two) times daily. 02/21/22   Ellsworth Lennox, PA-C  EPINEPHrine 0.3 mg/0.3 mL IJ SOAJ injection Inject into the muscle. 05/13/19   [provider]  erythromycin ophthalmic ointment Place a 1/2 inch ribbon of ointment into the lower eyelid 4 times daily for 7 days. 07/24/22   Gustavus Bryant, FNP  fluticasone (FLONASE) 50 MCG/ACT nasal spray Place 1 spray into both nostrils daily. 07/24/22   Gustavus Bryant, FNP  loteprednol (LOTEMAX) 0.5 % ophthalmic suspension Place 1 drop into both eyes 2 (two) times daily.      [provider]  omeprazole (PRILOSEC) 20 MG capsule Take 20 mg by mouth daily.      [provider]  omeprazole (PRILOSEC) 40 MG capsule Take 40 mg by mouth daily.      [provider]  oseltamivir (TAMIFLU) 75 MG capsule Take 1 capsule (75 mg total) by mouth every 12 (twelve) hours. 02/22/22   Mardella Layman, MD  pseudoephedrine-guaifenesin (MUCINEX D) 60-600 MG per tablet Take 1 tablet by mouth as needed.      [provider]  Spacer/Aero-Hold Chamber Bags MISC 1 Units by Does not apply route in the morning, at noon, and at bedtime. 02/21/22   Ellsworth Lennox, PA-C      Allergies  Codeine, Hydrocodone-acetaminophen, and Shellfish allergy    Review of Systems   Review of Systems  Constitutional:  Positive for chills and fever.  Respiratory:  Positive for shortness of breath.   Cardiovascular:  Negative for chest pain.  Gastrointestinal:  Positive for diarrhea and nausea. Negative for blood in stool and vomiting.  Musculoskeletal:  Positive for myalgias.  Neurological:  Positive for headaches.  All other systems reviewed and are negative.   Physical Exam Updated Vital Signs BP 131/83   Pulse 95   Temp 98.1 F (36.7 C) (Oral)   Resp 18    Ht 5\' 10"  (1.778 m)   Wt 88.9 kg   SpO2 98%   BMI 28.12 kg/m  Physical Exam Vitals and nursing note reviewed.  Constitutional:      General: He is not in acute distress.    Appearance: Normal appearance. He is not ill-appearing, toxic-appearing or diaphoretic.  HENT:     Head: Normocephalic and atraumatic.     Nose: Nose normal.     Mouth/Throat:     Mouth: Mucous membranes are moist.     Pharynx: Oropharynx is clear.  Eyes:     Extraocular Movements: Extraocular movements intact.     Conjunctiva/sclera: Conjunctivae normal.     Pupils: Pupils are equal, round, and reactive to light.  Cardiovascular:     Rate and Rhythm: Normal rate and regular rhythm.  Pulmonary:     Effort: Pulmonary effort is normal.     Breath sounds: Normal breath sounds. No wheezing.  Abdominal:     General: Abdomen is flat. Bowel sounds are normal.     Palpations: Abdomen is soft.     Tenderness: There is no abdominal tenderness.  Musculoskeletal:     Cervical back: Normal range of motion and neck supple. No tenderness.  Skin:    General: Skin is warm and dry.     Capillary Refill: Capillary refill takes less than 2 seconds.  Neurological:     Mental Status: He is alert and oriented to person, place, and time.     ED Results / Procedures / Treatments   Labs (all labs ordered are listed, but only abnormal results are displayed) Labs Reviewed  COMPREHENSIVE METABOLIC PANEL - Abnormal; Notable for the following components:      Result Value   Sodium 131 (*)    CO2 21 (*)    Glucose, Bld 122 (*)    Calcium 8.4 (*)    All other components within normal limits  SARS CORONAVIRUS 2 BY RT PCR  LIPASE, BLOOD  CBC  URINALYSIS, ROUTINE W REFLEX MICROSCOPIC    EKG None  Radiology DG Chest 2 View  Result Date: 01/14/2023 CLINICAL DATA:  Cough EXAM: CHEST - 2 VIEW COMPARISON:  None Available. FINDINGS: Normal heart size. Normal mediastinal contour. No pneumothorax. No pleural effusion. Patchy  hazy bibasilar lung opacities. IMPRESSION: Patchy hazy bibasilar lung opacities, suspicious for multifocal pneumonia. Recommend follow-up chest radiographs to resolution. Electronically Signed   By: Delbert Phenix M.D.   On: 01/14/2023 16:50    Procedures Procedures   Medications Ordered in ED Medications  sodium chloride 0.9 % bolus 1,000 mL (0 mLs Intravenous Stopped 01/14/23 2023)  cefTRIAXone (ROCEPHIN) 1 g in sodium chloride 0.9 % 100 mL IVPB (0 g Intravenous Stopped 01/14/23 2023)  azithromycin (ZITHROMAX) 500 mg in sodium chloride 0.9 % 250 mL IVPB (500 mg Intravenous New Bag/Given 01/14/23 2033)  acetaminophen (TYLENOL) tablet 650 mg (650 mg Oral Given  01/14/23 2031)    ED Course/ Medical Decision Making/ A&P  Medical Decision Making Amount and/or Complexity of Data Reviewed Labs: ordered. Radiology: ordered.  Risk OTC drugs.   47 year old male presents to ED for evaluation.  Please see HPI for further details.  On examination the patient is afebrile, nontachycardic.  His lung sounds are clear bilaterally and he is not hypoxic on room air.  Abdomen is soft and compressible throughout.  Neurological examination at baseline.  No edema to bilateral lower extremities.  Overall nontoxic in appearance.  CBC shows no leukocytosis, no anemia.  Metabolic panel shows sodium 131, potassium 3.8.  Patient creatinine not elevated.  Patient LFTs not elevated.  Patient lipase WNL.  Urinalysis negative for all.  Viral panel negative for all.  Chest x-ray shows multifocal pneumonia.  Patient started on CAP treatment at this time, Rocephin and azithromycin.  Patient provided 1 L fluid.  Provided 650 Tylenol for left-sided flank pain.  Patient ambulated, patient does not desaturate with ambulation.  Oxygen saturation remains between 98 and 100% on room air.  Respirations nonlabored.  At this time, the patient be discharged home.  He will be discharged on Treatment.  He will follow-up with his PCP for  reevaluation/reimaging of chest to ensure resolution.  Return precautions have been given and the patient has voiced understanding.  He has had all of his questions answered to his satisfaction.  He is stable to discharge at this time.   Final Clinical Impression(s) / ED Diagnoses Final diagnoses:  Community acquired pneumonia, unspecified laterality    Rx / DC Orders ED Discharge Orders          Ordered    ondansetron (ZOFRAN) 4 MG tablet  Every 6 hours PRN        01/14/23 2221    doxycycline (VIBRAMYCIN) 100 MG capsule  2 times daily        01/14/23 2221              Al Decant, PA-C 01/14/23 2221    Franne Forts, DO 01/17/23 [phone]

## 2023-01-14 NOTE — ED Notes (Signed)
Pt sats remained 98 - 100% with ambulation.  RR remained 18 - 20.

## 2024-08-29 ENCOUNTER — Ambulatory Visit (HOSPITAL_BASED_OUTPATIENT_CLINIC_OR_DEPARTMENT_OTHER): Admitting: Pulmonary Disease
